# Patient Record
Sex: Male | Born: 2000 | Race: Black or African American | Hispanic: No | Marital: Single | Smoking: Never smoker
Health system: Southern US, Community
[De-identification: ages and names within clinical notes are randomized; demographics above are authoritative.]

## PROBLEM LIST (undated history)

## (undated) DIAGNOSIS — K529 Noninfective gastroenteritis and colitis, unspecified: Secondary | ICD-10-CM

## (undated) HISTORY — PX: ADENOIDECTOMY: SUR15

## (undated) HISTORY — PX: TONSILLECTOMY: SUR1361

---
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
## 2021-07-31 ENCOUNTER — Ambulatory Visit (HOSPITAL_COMMUNITY)
Admission: EM | Admit: 2021-07-31 | Discharge: 2021-07-31 | Disposition: A | Discharge status: Home / Self Care | Payer: Self-pay | Attending: Internal Medicine | Admitting: Internal Medicine

## 2021-07-31 ENCOUNTER — Encounter (HOSPITAL_COMMUNITY): Payer: Self-pay

## 2021-07-31 ENCOUNTER — Ambulatory Visit (INDEPENDENT_AMBULATORY_CARE_PROVIDER_SITE_OTHER): Payer: Self-pay

## 2021-07-31 DIAGNOSIS — M25571 Pain in right ankle and joints of right foot: Secondary | ICD-10-CM

## 2021-07-31 DIAGNOSIS — S93401A Sprain of unspecified ligament of right ankle, initial encounter: Secondary | ICD-10-CM

## 2021-07-31 MED ORDER — DICLOFENAC SODIUM 75 MG PO TBEC: 75.0000 mg | DELAYED_RELEASE_TABLET | Freq: Two times a day (BID) | ORAL | 0 refills | Status: AC | PRN

## 2021-07-31 NOTE — ED Triage Notes (Signed)
Pt states he slipped and fell last pm and twisted his right ankle.  States he heard something pop.  Has been using Ice and Tylenol with some relief.

## 2021-07-31 NOTE — ED Notes (Signed)
Pt declines ASO brace at this time due to possibly having to pay out of pocket. Wishes to purchase his own ankle brace at pharmacy or medical supply store. Notified provider.

## 2021-07-31 NOTE — ED Provider Notes (Signed)
[facility]    CSN: [id number] Arrival date & time: 07/31/21  1357      History   Chief Complaint Chief Complaint  Patient presents with   Ankle Pain    HPI Jeremy Blake is a 21 y.o. male.   Pleasant 21 year old male presents today after sustaining a fall last evening in his bathroom.  He states he tripped and twisted his right ankle.  This was an inversion injury.  He reports swelling to the lateral malleolus with varying degrees of pain extending throughout his ankle.  He has not been able to fully ambulate secondary to the pain.  He denies any bruising.  He iced his ankle and took 1 tablet of ibuprofen.  He denies any decrease sensation.  He denies any pain to his toes or dorsal aspect of his foot.   Ankle Pain   Past Medical History:  Diagnosis Date   Gastroenteritis     Patient Active Problem List   Diagnosis Date Noted   Gastroenteritis 04/24/2011    Past Surgical History:  Procedure Laterality Date   ADENOIDECTOMY     TONSILLECTOMY         Home Medications    Prior to Admission medications   Medication Sig Start Date End Date Taking? Authorizing Provider  diclofenac (VOLTAREN) 75 MG EC tablet Take 1 tablet (75 mg total) by mouth 2 (two) times daily as needed for moderate pain or mild pain. Take with food 07/31/21  Yes Adison Jerger L, PA    Family History Family History  Problem Relation Age of Onset   Autism spectrum disorder Brother    Hypertension Mother     Social History Social History   Tobacco Use   Smoking status: Never   Smokeless tobacco: Never  Substance Use Topics   Alcohol use: No   Drug use: Never     Allergies   Peanut-containing drug products   Review of Systems Review of Systems  Musculoskeletal:  Positive for arthralgias.     Physical Exam Triage Vital Signs ED Triage Vitals  Enc Vitals Group     BP 07/31/21 1438 (!) 156/99     Pulse Rate 07/31/21 1438 (!) 130     Resp 07/31/21 1438 16      Temp 07/31/21 1438 98.6 F (37 C)     Temp src --      SpO2 07/31/21 1438 99 %     Weight --      Height --      Head Circumference --      Peak Flow --      Pain Score 07/31/21 1440 10     Pain Loc --      Pain Edu? --      Excl. in [place]? --    No data found.  Updated Vital Signs BP (!) 156/99 (BP Location: Left Arm)   Pulse (!) 130   Temp 98.6 F (37 C)   Resp 16   SpO2 99%   Visual Acuity Right Eye Distance:   Left Eye Distance:   Bilateral Distance:    Right Eye Near:   Left Eye Near:    Bilateral Near:     Physical Exam Vitals and nursing note reviewed. Exam conducted with a chaperone present.  Constitutional:      General: He is not in acute distress.    Appearance: Normal appearance. He is obese. He is not ill-appearing, toxic-appearing or diaphoretic.  Cardiovascular:  Pulses: Normal pulses.  Pulmonary:     Effort: Pulmonary effort is normal. No respiratory distress.  Musculoskeletal:        General: Swelling and tenderness present. No deformity.     Right lower leg: No swelling, deformity, lacerations, tenderness or bony tenderness. No edema.     Left lower leg: Normal. No swelling, deformity, lacerations, tenderness or bony tenderness. No edema.     Right ankle: Swelling present. No deformity, ecchymosis or lacerations. Tenderness present over the lateral malleolus, ATF ligament, AITF ligament, CF ligament and posterior TF ligament. No base of 5th metatarsal or proximal fibula tenderness. Normal range of motion. Anterior drawer test negative. Normal pulse.     Right Achilles Tendon: Normal. No tenderness or defects. Thompson's test negative.     Left ankle: Normal. No swelling, deformity, ecchymosis or lacerations. No tenderness. Normal range of motion. Anterior drawer test negative. Normal pulse.     Left Achilles Tendon: Normal. No tenderness or defects. Thompson's test negative.     Comments: Tenderness and swelling located primarily to the lateral  malleolus/ lateral aspect of ankle Achilles tendon intact and non-tender Negative homan sign Mild + talar tilt Negative anterior drawer Pes planus  Skin:    General: Skin is warm.     Capillary Refill: Capillary refill takes less than 2 seconds.     Coloration: Skin is not jaundiced.     Findings: No erythema or rash.  Neurological:     General: No focal deficit present.     Mental Status: He is alert and oriented to person, place, and time.     Sensory: No sensory deficit.     Motor: No weakness.     Comments: Gait not assessed as pt in wheelchair      UC Treatments / Results  Labs (all labs ordered are listed, but only abnormal results are displayed) Labs Reviewed - No data to display  EKG   Radiology DG Ankle Complete Right  Result Date: 07/31/2021 CLINICAL DATA:  Fall, pain EXAM: RIGHT ANKLE - COMPLETE 3+ VIEW COMPARISON:  None Available. FINDINGS: There is no evidence of fracture, dislocation, or joint effusion. There is no evidence of arthropathy or other focal bone abnormality. Soft tissues are unremarkable. IMPRESSION: No acute abnormality Electronically Signed   By: Judie Petit.  Shick M.D.   On: 07/31/2021 15:06    Procedures Procedures (including critical care time)  Medications Ordered in UC Medications - No data to display  Initial Impression / Assessment and Plan / UC Course  I have reviewed the triage vital signs and the nursing notes.  Pertinent labs & imaging results that were available during my care of the patient were reviewed by me and considered in my medical decision making (see chart for details).     R ankle sprain -x-ray negative for acute fracture.  Recommended patient ice his ankle 3 times daily for the next 3 days.  Offered stabilizing ankle device here, patient requested to pick up his own due to concerns of insurance coverage.  Recommended patient purchase the velocity ankle brace or an Aircast.  Must wear daily for the next 2 to 3 weeks.   Diclofenac called in for patient, twice daily as needed with food.  Avoid additional over-the-counter NSAIDs.  Final Clinical Impressions(s) / UC Diagnoses   Final diagnoses:  Sprain of right ankle, unspecified ligament, initial encounter     Discharge Instructions      Your xray is negative.  You have a sprained  ankle, which is a partial tearing of the ligaments holding your bones together. Please wear the ankle brace daily for a minimum of 2 weeks, possibly up to one month. (Since you have preferred to pick this up yourself, please look for either the Velocity ankle brace (a lace up brace with hard plastic stabilization on the side) or the AirCast (a stirrup device with air on the sides).  Take the anti-inflammatory prescribed today twice daily with food as needed.  Do not take any additional OTC NSAIDS (advil, motrin, ibuprofen, aleve, naproxen).   Please ice your ankle several times daily for the first 3 days.  In phase 1 followed by phase 2 rehab exercises attached to this form after the swelling has improved. You may ambulate as tolerated.    ED Prescriptions     Medication Sig Dispense Auth. Provider   diclofenac (VOLTAREN) 75 MG EC tablet Take 1 tablet (75 mg total) by mouth 2 (two) times daily as needed for moderate pain or mild pain. Take with food 20 tablet Deonne Rooks L, PA      PDMP not reviewed this encounter.   Maretta Bees, [place] 07/31/21 [phone]

## 2021-07-31 NOTE — Discharge Instructions (Addendum)
Your xray is negative.  You have a sprained ankle, which is a partial tearing of the ligaments holding your bones together. Please wear the ankle brace daily for a minimum of 2 weeks, possibly up to one month. (Since you have preferred to pick this up yourself, please look for either the Velocity ankle brace (a lace up brace with hard plastic stabilization on the side) or the AirCast (a stirrup device with air on the sides).  Take the anti-inflammatory prescribed today twice daily with food as needed.  Do not take any additional OTC NSAIDS (advil, motrin, ibuprofen, aleve, naproxen).   Please ice your ankle several times daily for the first 3 days.  In phase 1 followed by phase 2 rehab exercises attached to this form after the swelling has improved. You may ambulate as tolerated.

## 2021-10-30 ENCOUNTER — Encounter (HOSPITAL_COMMUNITY): Payer: Self-pay | Admitting: *Deleted

## 2021-10-30 ENCOUNTER — Ambulatory Visit (HOSPITAL_COMMUNITY)
Admission: EM | Admit: 2021-10-30 | Discharge: 2021-10-30 | Disposition: A | Discharge status: Home / Self Care | Payer: Self-pay | Attending: Internal Medicine | Admitting: Internal Medicine

## 2021-10-30 DIAGNOSIS — R300 Dysuria: Secondary | ICD-10-CM | POA: Insufficient documentation

## 2021-10-30 DIAGNOSIS — Z113 Encounter for screening for infections with a predominantly sexual mode of transmission: Secondary | ICD-10-CM | POA: Insufficient documentation

## 2021-10-30 LAB — POCT URINALYSIS DIPSTICK, ED / UC
Bilirubin Urine: NEGATIVE
Glucose, UA: NEGATIVE mg/dL
Hgb urine dipstick: NEGATIVE
Ketones, ur: NEGATIVE mg/dL
Leukocytes,Ua: NEGATIVE
Nitrite: NEGATIVE
Protein, ur: NEGATIVE mg/dL
Specific Gravity, Urine: 1.02 (ref 1.005–1.030)
Urobilinogen, UA: 0.2 mg/dL (ref 0.0–1.0)
pH: 7 (ref 5.0–8.0)

## 2021-10-30 NOTE — ED Triage Notes (Signed)
Pt states he has burning when urinating x 4 days. He states that its been a month since last unprotected sexual encounter and he has a GF so there isnt a chance. No known exposures.

## 2021-10-30 NOTE — ED Provider Notes (Signed)
[facility]    CSN: [id number] Arrival date & time: 10/30/21  7517      History   Chief Complaint Chief Complaint  Patient presents with   Dysuria    HPI Jeremy Blake is a 21 y.o. male.   Patient presents urgent care for evaluation of dysuria that started 4 days ago.  He has been taking over-the-counter "urinary tract infection treatment that made his urine turn orange".  He has also been drinking cranberry juice for the last 4 days and attempt to help with the urinary pain but this has not been helping very much.  Denies abdominal pain, nausea, vomiting, urinary urgency, urinary hesitancy, hematuria, constipation, low back pain, fever/chills, diarrhea, and dizziness.  No recent new sexual partners.  He states that he has not been sexually active for the last month and denies known exposure to STD.  Denies penile discharge, rash, and itching.  He likes to drink sodas frequently but stopped doing this 4 days ago when he began to experience dysuria.  Denies frequent intake of caffeine or tea.  He has been attempting to drink plenty of water over the last few days to stay well-hydrated to prevent urinary symptoms from happening.   Dysuria Presenting symptoms: dysuria     Past Medical History:  Diagnosis Date   Gastroenteritis     Patient Active Problem List   Diagnosis Date Noted   Gastroenteritis 04/24/2011    Past Surgical History:  Procedure Laterality Date   ADENOIDECTOMY     TONSILLECTOMY         Home Medications    Prior to Admission medications   Medication Sig Start Date End Date Taking? Authorizing Provider  diclofenac (VOLTAREN) 75 MG EC tablet Take 1 tablet (75 mg total) by mouth 2 (two) times daily as needed for moderate pain or mild pain. Take with food 07/31/21   Maretta Bees, PA    Family History Family History  Problem Relation Age of Onset   Hypertension Mother    Autism spectrum disorder Brother     Social  History Social History   Tobacco Use   Smoking status: Never   Smokeless tobacco: Never  Vaping Use   Vaping Use: Never used  Substance Use Topics   Alcohol use: No   Drug use: Never     Allergies   Peanut-containing drug products   Review of Systems Review of Systems  Genitourinary:  Positive for dysuria.  Per HPI   Physical Exam Triage Vital Signs ED Triage Vitals  Enc Vitals Group     BP 10/30/21 0839 131/79     Pulse Rate 10/30/21 0839 (!) 56     Resp 10/30/21 0839 18     Temp 10/30/21 0839 98.7 F (37.1 C)     Temp Source 10/30/21 0839 Oral     SpO2 10/30/21 0839 95 %     Weight --      Height --      Head Circumference --      Peak Flow --      Pain Score 10/30/21 0837 10     Pain Loc --      Pain Edu? --      Excl. in [place]? --    No data found.  Updated Vital Signs BP 131/79 (BP Location: Left Arm)   Pulse (!) 56   Temp 98.7 F (37.1 C) (Oral)   Resp 18   SpO2 95%   Visual Acuity Right  Eye Distance:   Left Eye Distance:   Bilateral Distance:    Right Eye Near:   Left Eye Near:    Bilateral Near:     Physical Exam Vitals and nursing note reviewed.  Constitutional:      Appearance: He is not ill-appearing or toxic-appearing.  HENT:     Head: Normocephalic and atraumatic.     Right Ear: Hearing and external ear normal.     Left Ear: Hearing and external ear normal.     Nose: Nose normal.     Mouth/Throat:     Lips: Pink.  Eyes:     General: Lids are normal. Vision grossly intact. Gaze aligned appropriately.     Extraocular Movements: Extraocular movements intact.     Conjunctiva/sclera: Conjunctivae normal.  Pulmonary:     Effort: Pulmonary effort is normal.  Abdominal:     Tenderness: There is no right CVA tenderness or left CVA tenderness.  Genitourinary:    Comments: Deferred. Musculoskeletal:     Cervical back: Neck supple.  Skin:    General: Skin is warm and dry.     Capillary Refill: Capillary refill takes less than 2  seconds.     Findings: No rash.  Neurological:     General: No focal deficit present.     Mental Status: He is alert and oriented to person, place, and time. Mental status is at baseline.     Cranial Nerves: No dysarthria or facial asymmetry.  Psychiatric:        Mood and Affect: Mood normal.        Speech: Speech normal.        Behavior: Behavior normal.        Thought Content: Thought content normal.        Judgment: Judgment normal.      UC Treatments / Results  Labs (all labs ordered are listed, but only abnormal results are displayed) Labs Reviewed  POCT URINALYSIS DIPSTICK, ED / UC  CYTOLOGY, (ORAL, ANAL, URETHRAL) ANCILLARY ONLY    EKG   Radiology No results found.  Procedures Procedures (including critical care time)  Medications Ordered in UC Medications - No data to display  Initial Impression / Assessment and Plan / UC Course  I have reviewed the triage vital signs and the nursing notes.  Pertinent labs & imaging results that were available during my care of the patient were reviewed by me and considered in my medical decision making (see chart for details).   1.  Dysuria Urinalysis is unremarkable for signs of urinary tract infection or hematuria indicating possible nephrolithiasis.  Although patient has not been sexually active for the last month, he agrees to cytology screening for gonorrhea, chlamydia, and trichomoniasis.  These results will come back in the next 2 to 3 days and we will call patient if he is positive.  Advised to avoid intercourse until results come back.  If he is positive for any STDs, he will need to abstain from intercourse for at least 1 week while receiving treatment.  We will defer STD treatment until results come back.  He expresses agreement with plan.  Advised patient to increase water intake to at least 64 ounces of water to stay well-hydrated and reduce intake of known urinary irritants.   Discussed physical exam and available  lab work findings in clinic with patient.  Counseled patient regarding appropriate use of medications and potential side effects for all medications recommended or prescribed today. Discussed red flag signs  and symptoms of worsening condition,when to call the PCP office, return to urgent care, and when to seek higher level of care in the emergency department. Patient verbalizes understanding and agreement with plan. All questions answered. Patient discharged in stable condition.     Final Clinical Impressions(s) / UC Diagnoses   Final diagnoses:  Dysuria  Screen for STD (sexually transmitted disease)     Discharge Instructions      Your STD testing has been sent to the lab and will come back in the next 2 to 3 days.  We will call you if any of your results are positive requiring treatment and treat you at that time.   Urine is negative.  Avoid sexual intercourse until your STD results come back.  If any of your STD results are positive, you will need to avoid sexual intercourse for 7 days while you are being treated to prevent spread of STD.  Condom use is the best way to prevent spread of STDs.  Return to urgent care as needed.     ED Prescriptions   None    PDMP not reviewed this encounter.   Talbot Grumbling, FNP 10/30/21 0930

## 2021-10-30 NOTE — Discharge Instructions (Signed)
Your STD testing has been sent to the lab and will come back in the next 2 to 3 days.  We will call you if any of your results are positive requiring treatment and treat you at that time.   Urine is negative.  Avoid sexual intercourse until your STD results come back.  If any of your STD results are positive, you will need to avoid sexual intercourse for 7 days while you are being treated to prevent spread of STD.  Condom use is the best way to prevent spread of STDs.  Return to urgent care as needed.

## 2021-10-31 LAB — CYTOLOGY, (ORAL, ANAL, URETHRAL) ANCILLARY ONLY
Chlamydia: NEGATIVE
Comment: NEGATIVE
Comment: NEGATIVE
Comment: NORMAL
Neisseria Gonorrhea: NEGATIVE
Trichomonas: NEGATIVE

## 2022-10-04 DIAGNOSIS — L02811 Cutaneous abscess of head [any part, except face]: Secondary | ICD-10-CM | POA: Diagnosis not present
